# Patient Record
Sex: Male | Born: 1980 | Hispanic: No | Marital: Married | Smoking: Never smoker
Health system: Southern US, Community
[De-identification: ages and names within clinical notes are randomized; demographics above are authoritative.]

---

## 2019-11-27 ENCOUNTER — Emergency Department (HOSPITAL_COMMUNITY): Payer: Worker's Compensation

## 2019-11-27 ENCOUNTER — Emergency Department (HOSPITAL_COMMUNITY)
Admission: EM | Admit: 2019-11-27 | Discharge: 2019-11-27 | Disposition: A | Discharge status: Home / Self Care | Payer: Worker's Compensation | Attending: Emergency Medicine | Admitting: Emergency Medicine

## 2019-11-27 ENCOUNTER — Other Ambulatory Visit: Payer: Self-pay

## 2019-11-27 ENCOUNTER — Encounter (HOSPITAL_COMMUNITY): Payer: Self-pay

## 2019-11-27 DIAGNOSIS — S99911A Unspecified injury of right ankle, initial encounter: Secondary | ICD-10-CM | POA: Diagnosis present

## 2019-11-27 DIAGNOSIS — W139XXA Fall from, out of or through building, not otherwise specified, initial encounter: Secondary | ICD-10-CM | POA: Insufficient documentation

## 2019-11-27 DIAGNOSIS — S0990XA Unspecified injury of head, initial encounter: Secondary | ICD-10-CM | POA: Insufficient documentation

## 2019-11-27 DIAGNOSIS — Y990 Civilian activity done for income or pay: Secondary | ICD-10-CM | POA: Diagnosis not present

## 2019-11-27 DIAGNOSIS — S3991XA Unspecified injury of abdomen, initial encounter: Secondary | ICD-10-CM | POA: Diagnosis not present

## 2019-11-27 DIAGNOSIS — T1490XA Injury, unspecified, initial encounter: Secondary | ICD-10-CM

## 2019-11-27 DIAGNOSIS — S82851A Displaced trimalleolar fracture of right lower leg, initial encounter for closed fracture: Secondary | ICD-10-CM | POA: Insufficient documentation

## 2019-11-27 LAB — CBC
HCT: 45 % (ref 39.0–52.0)
Hemoglobin: 14.8 g/dL (ref 13.0–17.0)
MCH: 32.3 pg (ref 26.0–34.0)
MCHC: 32.9 g/dL (ref 30.0–36.0)
MCV: 98.3 fL (ref 80.0–100.0)
Platelets: 231 10*3/uL (ref 150–400)
RBC: 4.58 MIL/uL (ref 4.22–5.81)
RDW: 12.8 % (ref 11.5–15.5)
WBC: 9.4 10*3/uL (ref 4.0–10.5)
nRBC: 0 % (ref 0.0–0.2)

## 2019-11-27 LAB — COMPREHENSIVE METABOLIC PANEL
ALT: 155 U/L — ABNORMAL HIGH (ref 0–44)
AST: 67 U/L — ABNORMAL HIGH (ref 15–41)
Albumin: 4 g/dL (ref 3.5–5.0)
Alkaline Phosphatase: 86 U/L (ref 38–126)
Anion gap: 11 (ref 5–15)
BUN: 16 mg/dL (ref 6–20)
CO2: 21 mmol/L — ABNORMAL LOW (ref 22–32)
Calcium: 9 mg/dL (ref 8.9–10.3)
Chloride: 106 mmol/L (ref 98–111)
Creatinine, Ser: 0.99 mg/dL (ref 0.61–1.24)
GFR, Estimated: >60 mL/min (ref >60)
Glucose, Bld: 110 mg/dL — ABNORMAL HIGH (ref 70–99)
Potassium: 3.9 mmol/L (ref 3.5–5.1)
Sodium: 138 mmol/L (ref 135–145)
Total Bilirubin: 0.8 mg/dL (ref 0.3–1.2)
Total Protein: 7.4 g/dL (ref 6.5–8.1)

## 2019-11-27 LAB — I-STAT CHEM 8, ED
BUN: 21 mg/dL — ABNORMAL HIGH (ref 6–20)
Calcium, Ion: 1.13 mmol/L — ABNORMAL LOW (ref 1.15–1.40)
Chloride: 106 mmol/L (ref 98–111)
Creatinine, Ser: 0.9 mg/dL (ref 0.61–1.24)
Glucose, Bld: 110 mg/dL — ABNORMAL HIGH (ref 70–99)
HCT: 42 % (ref 39.0–52.0)
Hemoglobin: 14.3 g/dL (ref 13.0–17.0)
Potassium: 4 mmol/L (ref 3.5–5.1)
Sodium: 140 mmol/L (ref 135–145)
TCO2: 25 mmol/L (ref 22–32)

## 2019-11-27 LAB — PROTIME-INR
INR: 1 (ref 0.8–1.2)
Prothrombin Time: 12.4 seconds (ref 11.4–15.2)

## 2019-11-27 LAB — SAMPLE TO BLOOD BANK

## 2019-11-27 LAB — ETHANOL: Alcohol, Ethyl (B): <10 mg/dL (ref <10)

## 2019-11-27 LAB — LACTIC ACID, PLASMA: Lactic Acid, Venous: 1.2 mmol/L (ref 0.5–1.9)

## 2019-11-27 MED ORDER — IOHEXOL 300 MG/ML  SOLN
100.0000 mL | Freq: Once | INTRAMUSCULAR | Status: AC | PRN
  Administered 2019-11-27: 100 mL via INTRAVENOUS

## 2019-11-27 MED ORDER — FENTANYL CITRATE (PF) 100 MCG/2ML IJ SOLN: 100.0000 ug | INTRAMUSCULAR | Status: DC | PRN

## 2019-11-27 MED ORDER — ETOMIDATE 2 MG/ML IV SOLN
10.0000 mg | Freq: Once | INTRAVENOUS | Status: AC
  Administered 2019-11-27: 10 mg via INTRAVENOUS

## 2019-11-27 MED ORDER — MORPHINE SULFATE (PF) 4 MG/ML IV SOLN
INTRAVENOUS | Status: AC
  Administered 2019-11-27: 4 mg via INTRAVENOUS
  Filled 2019-11-27: qty 1

## 2019-11-27 MED ORDER — LACTATED RINGERS IV BOLUS
1000.0000 mL | Freq: Once | INTRAVENOUS | Status: AC
  Administered 2019-11-27: 1000 mL via INTRAVENOUS

## 2019-11-27 MED ORDER — MORPHINE SULFATE (PF) 4 MG/ML IV SOLN: 4.0000 mg | Freq: Once | INTRAVENOUS | Status: AC

## 2019-11-27 MED ORDER — SODIUM CHLORIDE 0.9 % IV SOLN
INTRAVENOUS | Status: AC | PRN
  Administered 2019-11-27: 10 mL/h via INTRAVENOUS

## 2019-11-27 MED ORDER — OXYCODONE-ACETAMINOPHEN 5-325 MG PO TABS
1.0000 | ORAL_TABLET | Freq: Four times a day (QID) | ORAL | 0 refills | Status: AC | PRN
Start: 2019-11-27 — End: 2019-12-02

## 2019-11-27 NOTE — Progress Notes (Signed)
Orthopedic Tech Progress Note Patient Details:  Chad Lucas Aug 19, 1980 [id number]  Ortho Devices Type of Ortho Device: Stirrup splint, Post (short leg) splint, Crutches Ortho Device/Splint Location: RLE Ortho Device/Splint Interventions: Ordered, Application, Adjustment   Post Interventions Patient Tolerated: Fair Instructions Provided: Poper ambulation with device, Care of device, Adjustment of device   Chad Lucas 11/27/2019, 3:56 PM

## 2019-11-27 NOTE — ED Provider Notes (Signed)
[facility] EMERGENCY DEPARTMENT Provider Note   CSN: [id number] Arrival date & time: 11/27/19  1204     History Chief Complaint  Patient presents with  . Leg Injury    [facility] is a 39 y.o. male.  The history is provided by the patient and the EMS personnel.  Trauma Mechanism of injury: piece of concrete at construction site fell on the right side of his body Injury location: leg Injury location detail: R lower leg Arrived directly from scene: yes   Protective equipment:       Hardhat      Suspicion of alcohol use: no      Suspicion of drug use: no  EMS/PTA data:      Ambulatory at scene: no      Blood loss: none      Responsiveness: alert      Oriented to: place, person, situation and time      Loss of consciousness: no      Amnesic to event: no      IV access: established      Medications administered: fentanyl      Immobilization: C-collar and long board  Current symptoms:      Pain scale: 9/10      Pain quality: aching      Pain timing: constant      Associated symptoms:            Reports back pain and neck pain.            Denies abdominal pain, chest pain, loss of consciousness, seizures and vomiting.   Relevant PMH:      Pharmacological risk factors:            No anticoagulation therapy.       History reviewed. No pertinent past medical history.  There are no problems to display for this patient.   History reviewed. No pertinent surgical history.     History reviewed. No pertinent family history.  Social History   Tobacco Use  . Smoking status: Never Smoker  . Smokeless tobacco: Never Used  Substance Use Topics  . Alcohol use: Never  . Drug use: Never    Home Medications Prior to Admission medications   Medication Sig Start Date End Date Taking? Authorizing Provider  oxyCODONE-acetaminophen (PERCOCET/ROXICET) 5-325 MG tablet Take 1-2 tablets by mouth every 6 (six) hours as needed for up to 5 days for  severe pain. 11/27/19 12/02/19  Loletha Carrow, MD    Allergies    Patient has no known allergies.  Review of Systems   Review of Systems  Constitutional: Negative for chills and fever.  HENT: Negative for ear pain and sore throat.   Eyes: Negative for pain and visual disturbance.  Respiratory: Negative for cough and shortness of breath.   Cardiovascular: Negative for chest pain and palpitations.  Gastrointestinal: Negative for abdominal pain and vomiting.  Genitourinary: Negative for dysuria and hematuria.  Musculoskeletal: Positive for back pain and neck pain. Negative for arthralgias.  Skin: Negative for color change and rash.  Neurological: Negative for seizures, loss of consciousness and syncope.  All other systems reviewed and are negative.   Physical Exam Updated Vital Signs BP (!) 135/98   Pulse 78   Temp 98.2 F (36.8 C) (Oral)   Resp (!) 8   Ht 5\' 10"  (1.778 m)   Wt 113.4 kg   SpO2 99%   BMI 35.87 kg/m   Physical Exam Vitals and nursing  note reviewed.  Constitutional:      Appearance: He is well-developed. He is not ill-appearing, toxic-appearing or diaphoretic.  HENT:     Head: Normocephalic and atraumatic.     Right Ear: External ear normal.     Left Ear: External ear normal.     Nose: Nose normal.     Mouth/Throat:     Mouth: Mucous membranes are moist.     Pharynx: Oropharynx is clear.  Eyes:     Conjunctiva/sclera: Conjunctivae normal.     Pupils: Pupils are equal, round, and reactive to light.  Cardiovascular:     Rate and Rhythm: Normal rate and regular rhythm.     Pulses:          Radial pulses are 2+ on the right side and 2+ on the left side.       Dorsalis pedis pulses are 2+ on the right side and 2+ on the left side.     Heart sounds: No murmur heard.  No gallop.   Pulmonary:     Effort: Pulmonary effort is normal. No respiratory distress.     Breath sounds: Normal breath sounds.  Chest:     Chest wall: Tenderness (bilateral diffuse  tenderness) present.  Abdominal:     General: There is no distension.     Palpations: Abdomen is soft.     Tenderness: There is generalized abdominal tenderness.  Musculoskeletal:     Cervical back: Neck supple. Tenderness present. No deformity.     Thoracic back: Tenderness present. No deformity.     Lumbar back: Tenderness present. No deformity.     Right lower leg: No edema.     Left lower leg: No edema.     Comments: LUE and LLE atraumatic and nontender.  RUE also atraumatic and nontender.  RLE with tenderness and deformity about the right ankle.  Distally vascularly intact with warm toes, brisk capillary refill, palpable DP and PT pulses.  Intact sensation to the right foot but markedly decreased movement of the toes and ankle but will lift the entire leg off the bed.  Skin:    General: Skin is warm and dry.     Capillary Refill: Capillary refill takes less than 2 seconds.     Findings: No abrasion or laceration.  Neurological:     Mental Status: He is alert.     GCS: GCS eye subscore is 4. GCS verbal subscore is 5. GCS motor subscore is 6.     Comments: PERRL, GCS 15. Intact sensation and moves all extremities.      ED Results / Procedures / Treatments   Labs (all labs ordered are listed, but only abnormal results are displayed) Labs Reviewed  COMPREHENSIVE METABOLIC PANEL - Abnormal; Notable for the following components:      Result Value   CO2 21 (*)    Glucose, Bld 110 (*)    AST 67 (*)    ALT 155 (*)    All other components within normal limits  I-STAT CHEM 8, ED - Abnormal; Notable for the following components:   BUN 21 (*)    Glucose, Bld 110 (*)    Calcium, Ion 1.13 (*)    All other components within normal limits  CBC  ETHANOL  LACTIC ACID, PLASMA  PROTIME-INR  URINALYSIS, ROUTINE W REFLEX MICROSCOPIC  SAMPLE TO BLOOD BANK    EKG None  Radiology DG Tibia/Fibula Right  Result Date: 11/27/2019 CLINICAL DATA:  Heavy object fell on lower extremity  EXAM:  RIGHT TIBIA AND FIBULA - 2 VIEW COMPARISON:  Right ankle radiographs November 27, 2018 FINDINGS: Frontal and lateral views obtained. There is a comminuted fracture of the distal fibular diaphysis with lateral angulation and dorsal displacement distally. There is a fracture of the posterior tibial metaphysis with posterior displacement of the avulsed fragment. Fracture of the medial malleolus noted with the medial malleolus residing inferior to the tibial plafond. There is gross ankle mortise disruption. More proximally, no fracture or dislocation evident. Knee joint appears intact. No knee joint effusion. IMPRESSION: Fractures of the distal tibia and fibula with gross ankle mortise disruption noted and described in the right ankle report. More proximally, no fracture or dislocation. No knee joint abnormality evident. Electronically Signed   By: Bretta Bang III M.D.   On: 11/27/2019 13:46   DG Ankle 2 Views Right  Result Date: 11/27/2019 CLINICAL DATA:  Status post reduction of ankle fracture. EXAM: RIGHT ANKLE - 2 VIEW COMPARISON:  Earlier today at 12:34 p.m. FINDINGS: Interval reduction of previously described complex ankle fracture. Overlying splint or cast, obscuring bony detail. Improved alignment, with near complete resolution of lateral and posterior subluxation of the talar dome relative to the tibia. Comminuted distal fibular fracture again identified, relatively similar. IMPRESSION: Improved alignment of complex ankle fracture. Electronically Signed   By: Jeronimo Greaves M.D.   On: 11/27/2019 15:36   DG Ankle Complete Right  Result Date: 11/27/2019 CLINICAL DATA:  Heavy object fell on lower extremity EXAM: RIGHT ANKLE - COMPLETE 3+ VIEW COMPARISON:  None. FINDINGS: Frontal, oblique, and lateral views were obtained. There is a comminuted obliquely oriented fracture of the distal fibular diaphysis located approximately 10 cm proximal to the lateral malleolus. The major distal fracture fragment is  displaced laterally and angulated posteriorly with respect to the more proximal major fragment. There is a comminuted fracture of the proximal medial malleolus with the medial malleolus displaced laterally, residing inferior to the medial aspect of the tibial plafond and. There is a fracture of the posterior aspect of the distal tibial metaphysis with displacement of fracture fragments by up to 4 mm. There is gross ankle mortise disruption with the talar dome lateral and posterior to the tibial plafond. There is disruption of the distal tibiofibular syndesmosis. Other joint spaces appear unremarkable. IMPRESSION: Trimalleolar type fracture. Fracture of the distal fibular diaphysis with lateral displacement and posterior angulation of the distal major fracture fragment compared to the proximal major fracture fragment. There is disruption of the distal tibio-fibular syndesmosis. There is a comminuted fracture of the medial malleolus with the medial malleolus displaced laterally, residing inferior to the tibial plafond. There is a fracture of the posterior aspect of the distal tibial metaphysis with posterior displacement of the avulsed fragment. Gross ankle mortise disruption with the talar dome located lateral and posterior to the tibial plafond. Electronically Signed   By: Bretta Bang III M.D.   On: 11/27/2019 12:59   CT HEAD WO CONTRAST  Result Date: 11/27/2019 CLINICAL DATA:  Head trauma today EXAM: CT HEAD WITHOUT CONTRAST CT CERVICAL SPINE WITHOUT CONTRAST TECHNIQUE: Multidetector CT imaging of the head and cervical spine was performed following the standard protocol without intravenous contrast. Multiplanar CT image reconstructions of the cervical spine were also generated. COMPARISON:  None. FINDINGS: CT HEAD FINDINGS Brain: No evidence of acute infarction, hemorrhage, hydrocephalus, extra-axial collection or mass lesion/mass effect. Vascular: Negative for hyperdense vessel Skull: Negative  Sinuses/Orbits: Negative Other: None CT CERVICAL SPINE FINDINGS Alignment: Normal Skull  base and vertebrae: Negative for fracture Soft tissues and spinal canal: No mass or soft tissue swelling Disc levels: Mild disc degeneration in the cervical spine. Small central disc protrusion C4-5. Mild uncinate spurring C5-6. Upper chest: Lung apices clear bilaterally Other: None IMPRESSION: Negative CT head Negative for cervical spine fracture. Electronically Signed   By: Marlan Palauharles  Clark M.D.   On: 11/27/2019 13:24   CT CHEST W CONTRAST  Result Date: 11/27/2019 CLINICAL DATA:  A wall fell on the patient, chest and abdomen pain. EXAM: CT CHEST, ABDOMEN, AND PELVIS WITH CONTRAST TECHNIQUE: Multidetector CT imaging of the chest, abdomen and pelvis was performed following the standard protocol during bolus administration of intravenous contrast. CONTRAST:  100mL OMNIPAQUE IOHEXOL 300 MG/ML  SOLN COMPARISON:  None. FINDINGS: CT CHEST FINDINGS Cardiovascular: No significant vascular findings. Normal heart size. No pericardial effusion. Mediastinum/Nodes: No enlarged mediastinal, hilar, or axillary lymph nodes. Thyroid gland, trachea, and esophagus demonstrate no significant findings. Lungs/Pleura: A pneumatocele (which could be posttraumatic) versus bleb is seen in the inferior right lower lobe measuring 2.8 cm (series 504, image 54). There is a mild bilateral dependent atelectasis. There is no pleural effusion or pneumothorax. Musculoskeletal: No chest wall mass or suspicious bone lesions identified. CT ABDOMEN PELVIS FINDINGS Hepatobiliary: The liver is hypoattenuating, consistent with hepatic steatosis. No focal liver abnormality is seen. No gallstones, gallbladder wall thickening, or biliary dilatation. Pancreas: Unremarkable. No pancreatic ductal dilatation or surrounding inflammatory changes. Spleen: Normal in size without focal abnormality. Adrenals/Urinary Tract: Adrenal glands are unremarkable. Kidneys are normal,  without renal calculi, focal lesion, or hydronephrosis. Bladder is unremarkable. Stomach/Bowel: Stomach is within normal limits. Appendix appears normal. No evidence of bowel wall thickening, distention, or inflammatory changes. Vascular/Lymphatic: No significant vascular findings are present. No enlarged abdominal or pelvic lymph nodes. Reproductive: Prostate is unremarkable. Other: No abdominal wall hernia or abnormality. No abdominopelvic ascites. Musculoskeletal: No acute or significant osseous findings. IMPRESSION: 1. Pneumatocele versus bleb in the inferior right lower lobe. Otherwise, no acute injury in the chest, abdomen, or pelvis. 2. Hepatic steatosis. Electronically Signed   By: Romona Curlsyler  Litton M.D.   On: 11/27/2019 15:13   CT Cervical Spine Wo Contrast  Result Date: 11/27/2019 CLINICAL DATA:  Head trauma today EXAM: CT HEAD WITHOUT CONTRAST CT CERVICAL SPINE WITHOUT CONTRAST TECHNIQUE: Multidetector CT imaging of the head and cervical spine was performed following the standard protocol without intravenous contrast. Multiplanar CT image reconstructions of the cervical spine were also generated. COMPARISON:  None. FINDINGS: CT HEAD FINDINGS Brain: No evidence of acute infarction, hemorrhage, hydrocephalus, extra-axial collection or mass lesion/mass effect. Vascular: Negative for hyperdense vessel Skull: Negative Sinuses/Orbits: Negative Other: None CT CERVICAL SPINE FINDINGS Alignment: Normal Skull base and vertebrae: Negative for fracture Soft tissues and spinal canal: No mass or soft tissue swelling Disc levels: Mild disc degeneration in the cervical spine. Small central disc protrusion C4-5. Mild uncinate spurring C5-6. Upper chest: Lung apices clear bilaterally Other: None IMPRESSION: Negative CT head Negative for cervical spine fracture. Electronically Signed   By: Marlan Palauharles  Clark M.D.   On: 11/27/2019 13:24   CT ABDOMEN PELVIS W CONTRAST  Result Date: 11/27/2019 CLINICAL DATA:  A wall fell on  the patient, chest and abdomen pain. EXAM: CT CHEST, ABDOMEN, AND PELVIS WITH CONTRAST TECHNIQUE: Multidetector CT imaging of the chest, abdomen and pelvis was performed following the standard protocol during bolus administration of intravenous contrast. CONTRAST:  100mL OMNIPAQUE IOHEXOL 300 MG/ML  SOLN COMPARISON:  None. FINDINGS: CT CHEST  FINDINGS Cardiovascular: No significant vascular findings. Normal heart size. No pericardial effusion. Mediastinum/Nodes: No enlarged mediastinal, hilar, or axillary lymph nodes. Thyroid gland, trachea, and esophagus demonstrate no significant findings. Lungs/Pleura: A pneumatocele (which could be posttraumatic) versus bleb is seen in the inferior right lower lobe measuring 2.8 cm (series 504, image 54). There is a mild bilateral dependent atelectasis. There is no pleural effusion or pneumothorax. Musculoskeletal: No chest wall mass or suspicious bone lesions identified. CT ABDOMEN PELVIS FINDINGS Hepatobiliary: The liver is hypoattenuating, consistent with hepatic steatosis. No focal liver abnormality is seen. No gallstones, gallbladder wall thickening, or biliary dilatation. Pancreas: Unremarkable. No pancreatic ductal dilatation or surrounding inflammatory changes. Spleen: Normal in size without focal abnormality. Adrenals/Urinary Tract: Adrenal glands are unremarkable. Kidneys are normal, without renal calculi, focal lesion, or hydronephrosis. Bladder is unremarkable. Stomach/Bowel: Stomach is within normal limits. Appendix appears normal. No evidence of bowel wall thickening, distention, or inflammatory changes. Vascular/Lymphatic: No significant vascular findings are present. No enlarged abdominal or pelvic lymph nodes. Reproductive: Prostate is unremarkable. Other: No abdominal wall hernia or abnormality. No abdominopelvic ascites. Musculoskeletal: No acute or significant osseous findings. IMPRESSION: 1. Pneumatocele versus bleb in the inferior right lower lobe.  Otherwise, no acute injury in the chest, abdomen, or pelvis. 2. Hepatic steatosis. Electronically Signed   By: Romona Curls M.D.   On: 11/27/2019 15:13   DG Pelvis Portable  Result Date: 11/27/2019 CLINICAL DATA:  Heavy object fell on patient EXAM: PORTABLE PELVIS 1-2 VIEWS COMPARISON:  None. FINDINGS: Frontal view obtained. Note that the superior aspects of the iliac crests not included. No fracture or dislocation in visualized bony structures. Joint spaces appear normal. No erosive change. IMPRESSION: Visualized bones appear intact without fracture or dislocation. No appreciable arthropathy. Note that portions of each superior iliac crest not included on this study. Electronically Signed   By: Bretta Bang III M.D.   On: 11/27/2019 12:54   DG Chest Port 1 View  Result Date: 11/27/2019 CLINICAL DATA:  Surveyor, quantity fell on patient EXAM: PORTABLE CHEST 1 VIEW COMPARISON:  None. FINDINGS: Low lung volumes. No consolidation or edema. No pleural effusion or pneumothorax. Heart size is normal for technique. Osseous structures are grossly intact. IMPRESSION: No acute process in the chest. Electronically Signed   By: Guadlupe Spanish M.D.   On: 11/27/2019 12:53   DG Knee Complete 4 Views Right  Result Date: 11/27/2019 CLINICAL DATA:  Heavy objects fell on lower extremity EXAM: RIGHT KNEE - COMPLETE 4+ VIEW COMPARISON:  None. FINDINGS: Frontal, lateral, and bilateral oblique views were obtained. No fracture or dislocation. No joint effusion. Joint spaces appear normal. No erosive change. IMPRESSION: No fracture, dislocation, or joint effusion. No appreciable arthropathy. Electronically Signed   By: Bretta Bang III M.D.   On: 11/27/2019 12:55   DG Foot 2 Views Right  Result Date: 11/27/2019 CLINICAL DATA:  Heavy object fell on lower extremity EXAM: RIGHT FOOT - 2 VIEW COMPARISON:  Right ankle radiographs November 27, 2019 FINDINGS: Frontal and lateral views were obtained. Fractures of  the distal fibula, medial malleolus, and distal tibia with ankle mortise disruption noted and described in the ankle report. In the foot region, no appreciable fracture or dislocation evident. No joint space narrowing. IMPRESSION: Fractures of the distal tibia and fibula with ankle mortise disruption described in the ankle report. In the foot region, no fracture or dislocation. No appreciable joint space narrowing or erosion. Electronically Signed   By: Bretta Bang III M.D.  On: 11/27/2019 13:44    Procedures .Sedation  Date/Time: 11/27/2019 2:52 PM Performed by: Loletha Carrow, MD Authorized by: Maia Plan, MD   Consent:    Consent obtained:  Verbal, written and emergent situation   Consent given by:  Patient   Risks discussed:  Allergic reaction, inadequate sedation, nausea, vomiting, prolonged sedation necessitating reversal, prolonged hypoxia resulting in organ damage and respiratory compromise necessitating ventilatory assistance and intubation   Alternatives discussed:  Analgesia without sedation and anxiolysis Universal protocol:    Immediately prior to procedure a time out was called: yes   Indications:    Procedure performed:  Fracture reduction Pre-sedation assessment:    Time since last food or drink:  6hrs   ASA classification: class 1 - normal, healthy patient     Neck mobility: normal     Mouth opening:  3 or more finger widths   Thyromental distance:  2 finger widths   Mallampati score:  II - soft palate, uvula, fauces visible   Pre-sedation assessments completed and reviewed: airway patency, cardiovascular function, hydration status, mental status, nausea/vomiting, pain level, respiratory function and temperature     Pre-sedation assessment completed:  11/27/2019 2:52 PM Immediate pre-procedure details:    Reassessment: Patient reassessed immediately prior to procedure     Reviewed: vital signs, relevant labs/tests and NPO status     Verified: bag valve  mask available, emergency equipment available, intubation equipment available, IV patency confirmed and oxygen available   Procedure details (see MAR for exact dosages):    Preoxygenation:  Room air   Sedation:  Etomidate   Intended level of sedation: deep   Intra-procedure monitoring:  Blood pressure monitoring, cardiac monitor, continuous pulse oximetry, continuous capnometry, frequent LOC assessments and frequent vital sign checks   Intra-procedure events: none     Total Provider sedation time (minutes):  12 Post-procedure details:    Post-sedation assessment completed:  11/27/2019 3:30 PM   Attendance: Constant attendance by certified staff until patient recovered     Recovery: Patient returned to pre-procedure baseline     Post-sedation assessments completed and reviewed: airway patency, cardiovascular function, hydration status, mental status, nausea/vomiting, pain level, respiratory function and temperature     Patient is stable for discharge or admission: yes     Patient tolerance:  Tolerated well, no immediate complications  .Ortho Injury Treatment  Date/Time: 11/27/2019 7:07 PM Performed by: Loletha Carrow, MD Authorized by: Maia Plan, MD   Consent:    Consent obtained:  Verbal, written and emergent situation   Consent given by:  Patient   Risks discussed:  Irreducible dislocation and recurrent dislocation   Alternatives discussed:  No treatment and immobilizationInjury location: ankle Location details: right ankle Injury type: fracture-dislocation Fracture type: trimalleolar Pre-procedure neurovascular assessment: neurovascularly intact  Patient sedated: Yes. Refer to sedation procedure documentation for details of sedation. Manipulation performed: yes Reduction successful: yes Immobilization: splint and crutches Splint type: ankle stirrup and short leg Post-procedure neurovascular assessment: post-procedure neurovascularly intact Patient tolerance: patient  tolerated the procedure well with no immediate complications    (including critical Lucas time)  Medications Ordered in ED Medications  fentaNYL (SUBLIMAZE) injection 100 mcg (has no administration in time range)  iohexol (OMNIPAQUE) 300 MG/ML solution 100 mL (100 mLs Intravenous Contrast Given 11/27/19 1308)  lactated ringers bolus 1,000 mL (0 mLs Intravenous Stopped 11/27/19 1637)  etomidate (AMIDATE) injection 10 mg (10 mg Intravenous Given 11/27/19 1505)  0.9 %  sodium chloride infusion ( Intravenous Stopped 11/27/19  1539)  morphine 4 MG/ML injection 4 mg (4 mg Intravenous Given 11/27/19 1509)    ED Course  I have reviewed the triage vital signs and the nursing notes.  Pertinent labs & imaging results that were available during my Lucas of the patient were reviewed by me and considered in my medical decision making (see chart for details).    MDM Rules/Calculators/A&P                          The patient is a 39yo male, PMH otherwise healthy, who presents to the ED via EMS for traumatic injuries after part of a concrete wall fell on him.  On my initial evaluation, the patient is  hemodynamically stable, afebrile, nontoxic-appearing. Physical exam remarkable for R ankle deformity, diffuse tenderness to palpation.  Differentials considered include vertebral fractures, intrathoracic or intraabdominal injuries, rib fractures.   Patient provided IV fentanyl for pain, after which he had normal ROM of his toes. Labs unremarkable. CT scans and x-rays remarkable for R ankle trimalleolar fracture but otherwise no injuries.  C collar cleared. Obtained consent for sedation and trimalleolar reduction.   Sedation and reduction performed as documented above, patient tolerated well. Postreduction film shows improved alignment. Consulted orthopedics, who confirmed satisfactory reduction and reported pateint should follow up with Dr. Neva Seat.   Advised patient of concern for trimalleolar R ankle  fracture. Advised treatment of symptoms with NWB with crutches, RICE therapy, and prescribed percocet after reviewing PDMP. Recommended follow-up with PCP in the next couple days and recommended follow up with orthopedics for likely outpatient surgery. Strict return precautions provided. Patient discharged in stable condition.   The Lucas of this patient was overseen by Dr. Jacqulyn Bath, who agreed with evaluation and plan of Lucas.   Final Clinical Impression(s) / ED Diagnoses Final diagnoses:  Trauma  Closed trimalleolar fracture of right ankle, initial encounter    Rx / DC Orders ED Discharge Orders         Ordered    oxyCODONE-acetaminophen (PERCOCET/ROXICET) 5-325 MG tablet  Every 6 hours PRN        11/27/19 1546           Loletha Carrow, MD 11/27/19 1911    Maia Plan, MD 11/28/19 1042

## 2019-11-27 NOTE — Progress Notes (Signed)
Orthopedic Tech Progress Note Patient Details:  Chad Lucas 03-20-1980 [id number] Level 2 Trauma. Not needed at the moment. Patient ID: Chad Lucas, male   DOB: [birth year], 39 y.o.   MRN: [id number]   Chad Lucas 11/27/2019, 2:10 PM

## 2019-11-27 NOTE — Progress Notes (Signed)
Responded to level 2 ED page to support patient and staff. Pt report to ED after being involved in work accident on [employer] site. Spoke with patient nurse who informed me that patient had called wife and at the moment did not need chaplain services. Will page Chaplain if needed later.  Will follow as needed.   Venida Jarvis, [place], [facility], Pager [phone]

## 2019-11-27 NOTE — Discharge Instructions (Addendum)
Call to schedule follow up with Dr. Jena Gauss of orthopedics next week

## 2019-11-27 NOTE — ED Triage Notes (Signed)
Pt BIB [employer], he works in [employer], a concrete wall fell hitting pt in the head and landing in his Right side, Right leg deformity. Pt denies LOC, was wearing a hard hat. Pt a/o x4. Palpable pulses   100 mcg fentanyl given  18g RAC   BP 136/80 HR 95 95% RA  CBG 136

## 2021-06-30 IMAGING — CT CT CERVICAL SPINE W/O CM
3 of 9 series · 12 of 33 positions shown, 15 images · non-contrast
Comparison: None.

CLINICAL DATA: Head trauma today

EXAM:
CT HEAD WITHOUT CONTRAST
CT CERVICAL SPINE WITHOUT CONTRAST
TECHNIQUE: Multidetector CT imaging of the head and cervical spine was
performed following the standard protocol without intravenous
contrast. Multiplanar CT image reconstructions of the cervical spine
were also generated.

[Series 11: sag bone · sagittal · 0.31mm/px · 5 of 61 slices shown, 6 images]
[im 21/61  bone]
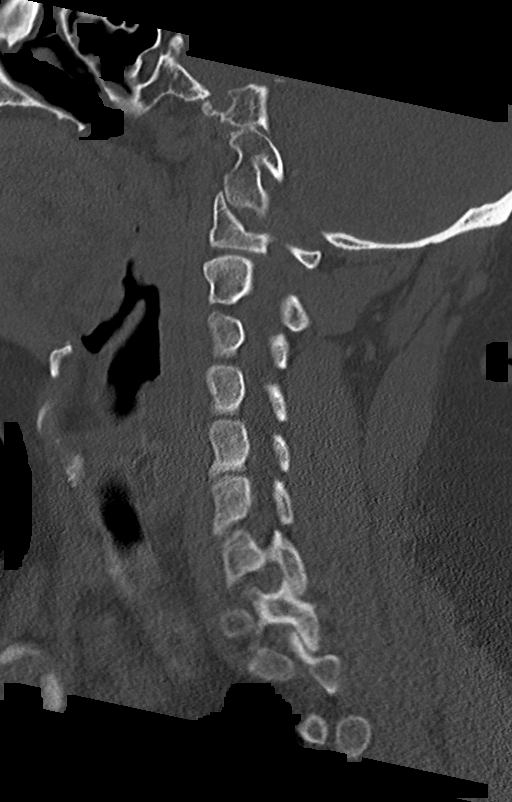
[im 26/61  bone]
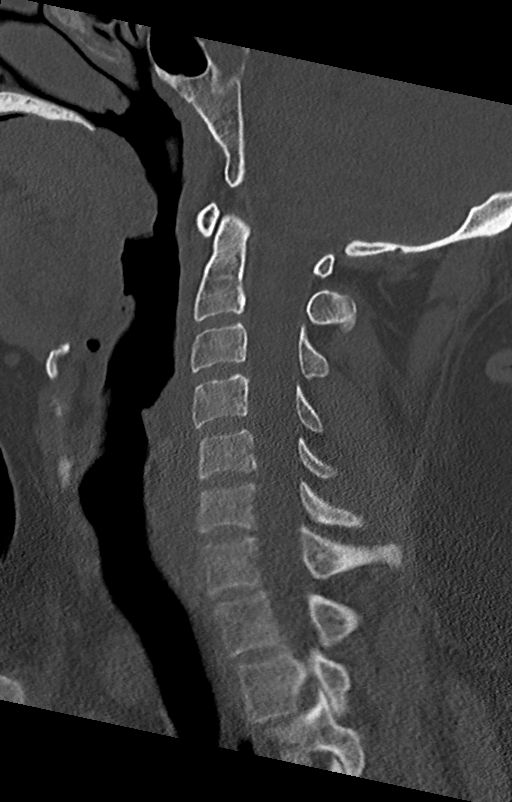
[im 31/61  soft-tissue]
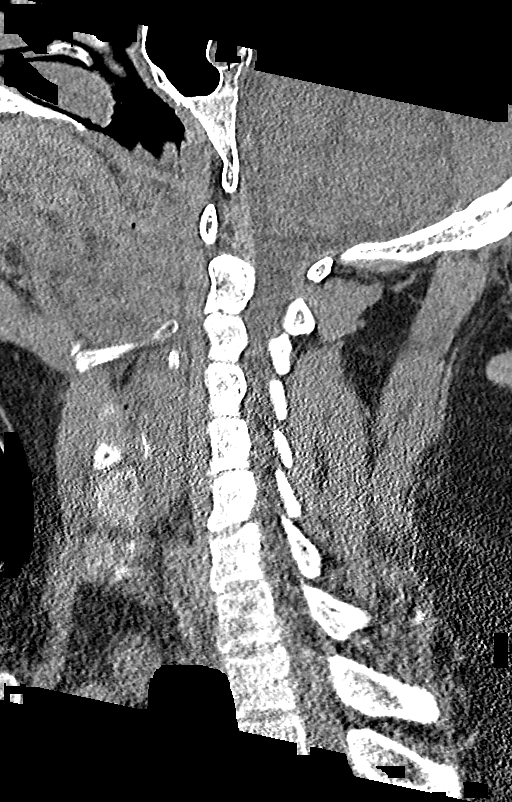
[im 31/61  bone]
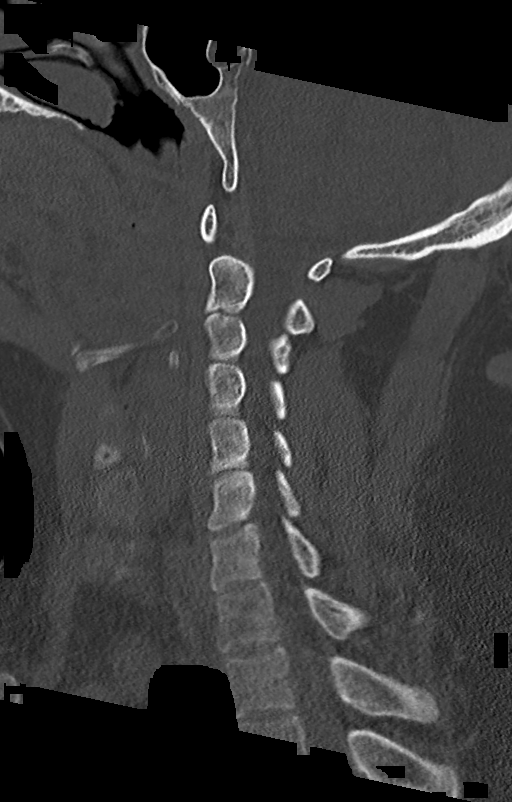
[im 36/61  bone]
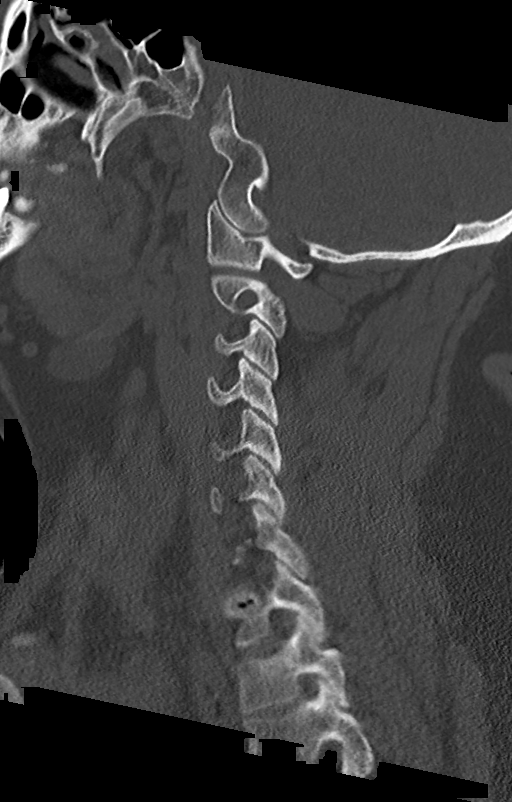
[im 41/61  bone]
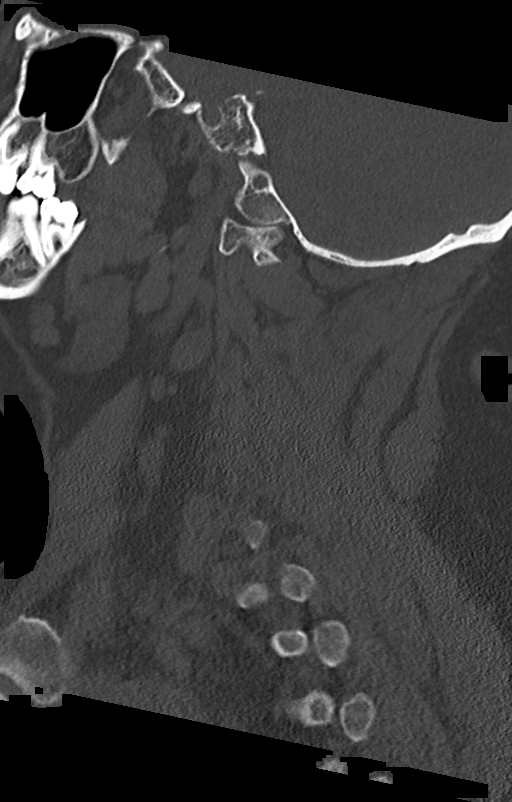

[Series 16: thins · axial · 0.92mm/px · z∈[-491,-61]mm · 5 of 807 slices shown, 7 images]
[im 135/807  soft-tissue]
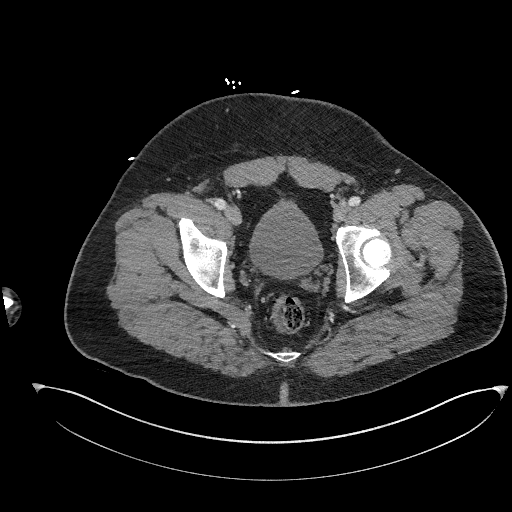
[im 135/807  bone]
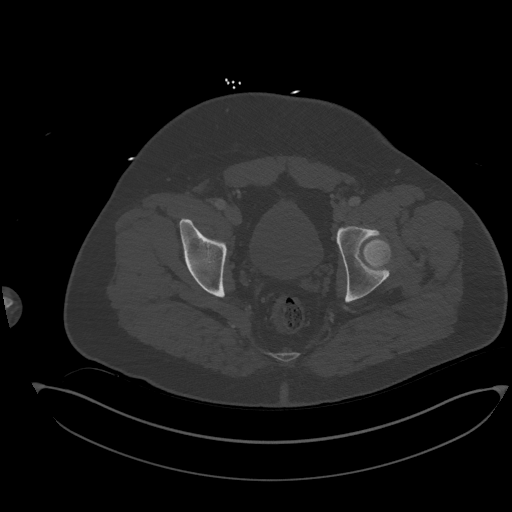
[im 269/807  bone]
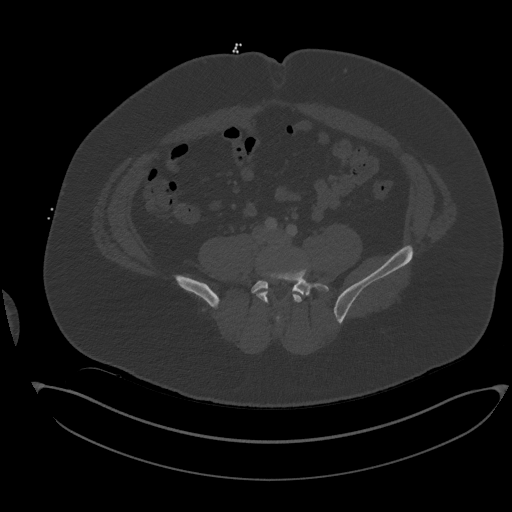
[im 404/807  bone]
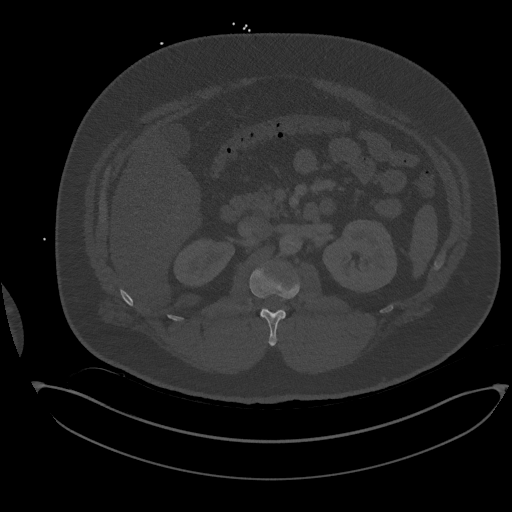
[im 538/807  bone]
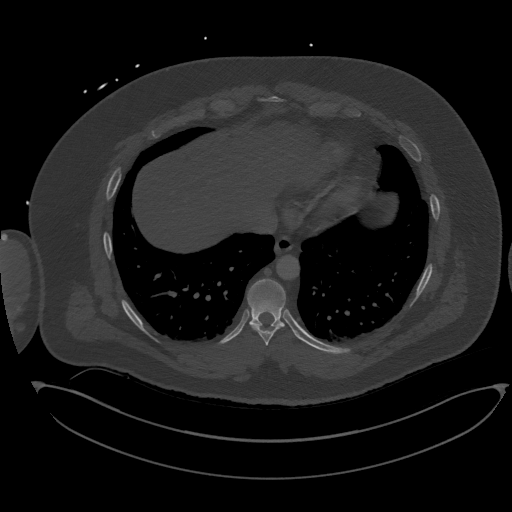
[im 672/807  soft-tissue]
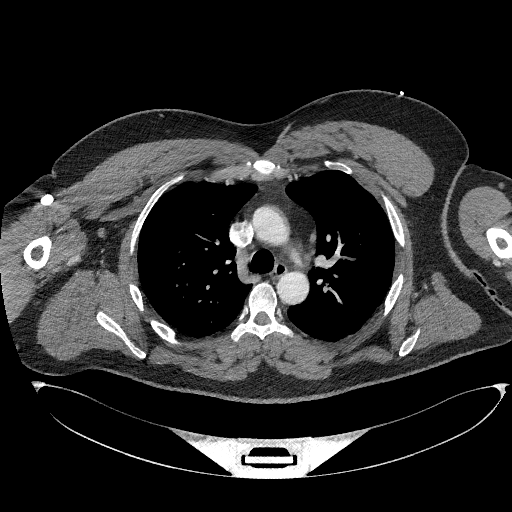
[im 672/807  bone]
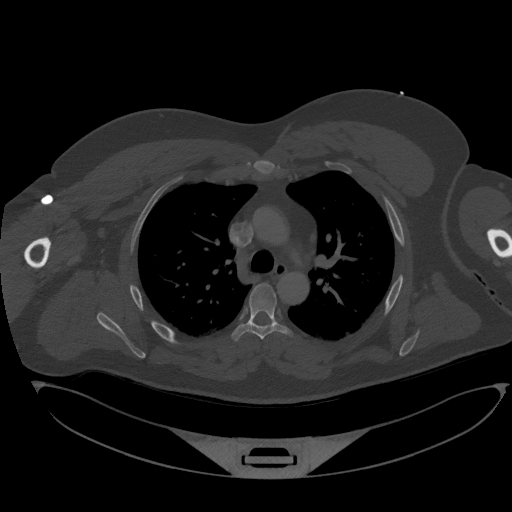

[Series 17: coronal · coronal · 0.90mm/px · 2 of 129 slices shown]
[im 43/129  bone]
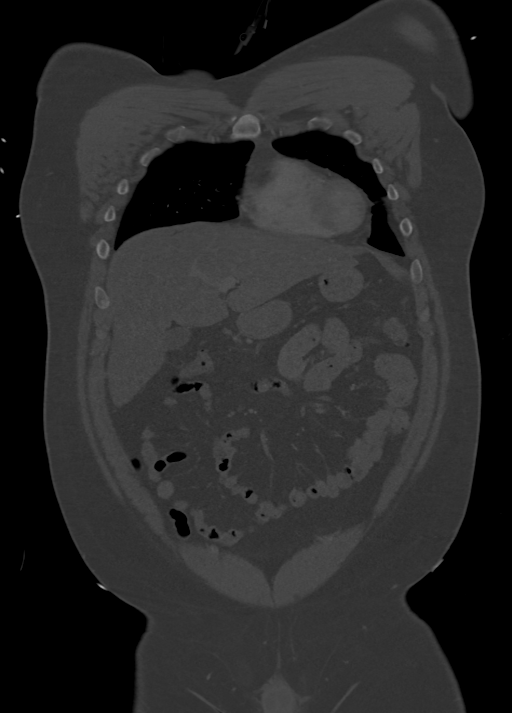
[im 86/129  bone]
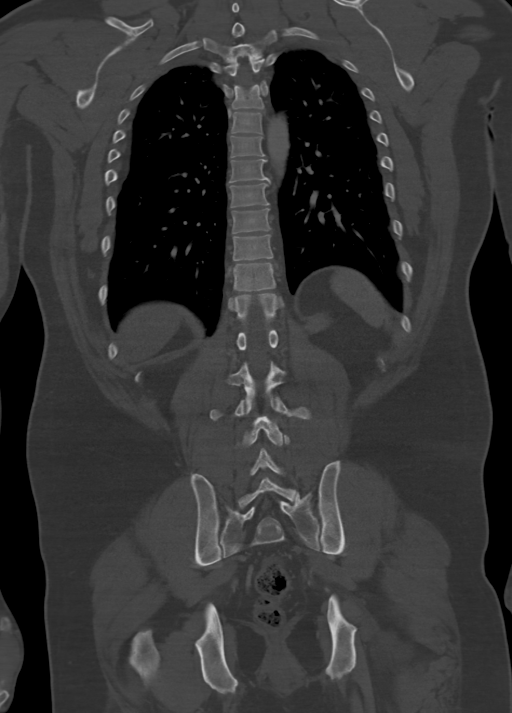

[12 of 33 positions shown; findings below may reference images not displayed]

FINDINGS: CT HEAD FINDINGS

Brain: No evidence of acute infarction, hemorrhage, hydrocephalus,
extra-axial collection or mass lesion/mass effect.

Vascular: Negative for hyperdense vessel

Skull: Negative

Sinuses/Orbits: Negative

Other: None

CT CERVICAL SPINE FINDINGS

Alignment: Normal

Skull base and vertebrae: Negative for fracture

Soft tissues and spinal canal: No mass or soft tissue swelling

Disc levels: Mild disc degeneration in the cervical spine. Small
central disc protrusion C4-5. Mild uncinate spurring C5-6.

Upper chest: Lung apices clear bilaterally

Other: None
IMPRESSION: Negative CT head

Negative for cervical spine fracture.
